# Patient Record
Sex: Male | Born: 1990 | Race: Black or African American | Hispanic: Yes | Marital: Married | Smoking: Never smoker
Health system: Southern US, Community
[De-identification: ages and names within clinical notes are randomized; demographics above are authoritative.]

---

## 2016-12-21 ENCOUNTER — Emergency Department (HOSPITAL_COMMUNITY): Payer: Self-pay

## 2016-12-21 ENCOUNTER — Encounter (HOSPITAL_COMMUNITY): Payer: Self-pay | Admitting: Emergency Medicine

## 2016-12-21 ENCOUNTER — Emergency Department (HOSPITAL_COMMUNITY)
Admission: EM | Admit: 2016-12-21 | Discharge: 2016-12-21 | Disposition: A | Discharge status: Home / Self Care | Payer: Self-pay | Attending: Emergency Medicine | Admitting: Emergency Medicine

## 2016-12-21 DIAGNOSIS — R809 Proteinuria, unspecified: Secondary | ICD-10-CM | POA: Insufficient documentation

## 2016-12-21 DIAGNOSIS — N50819 Testicular pain, unspecified: Secondary | ICD-10-CM

## 2016-12-21 DIAGNOSIS — N5082 Scrotal pain: Secondary | ICD-10-CM

## 2016-12-21 DIAGNOSIS — N390 Urinary tract infection, site not specified: Secondary | ICD-10-CM | POA: Insufficient documentation

## 2016-12-21 DIAGNOSIS — N50811 Right testicular pain: Secondary | ICD-10-CM | POA: Insufficient documentation

## 2016-12-21 DIAGNOSIS — R319 Hematuria, unspecified: Secondary | ICD-10-CM

## 2016-12-21 DIAGNOSIS — R1030 Lower abdominal pain, unspecified: Secondary | ICD-10-CM

## 2016-12-21 LAB — CBC
HCT: 42 % (ref 39.0–52.0)
Hemoglobin: 14 g/dL (ref 13.0–17.0)
MCH: 27.1 pg (ref 26.0–34.0)
MCHC: 33.3 g/dL (ref 30.0–36.0)
MCV: 81.2 fL (ref 78.0–100.0)
PLATELETS: 261 10*3/uL (ref 150–400)
RBC: 5.17 MIL/uL (ref 4.22–5.81)
RDW: 13.3 % (ref 11.5–15.5)
WBC: 9.7 10*3/uL (ref 4.0–10.5)

## 2016-12-21 LAB — URINALYSIS, ROUTINE W REFLEX MICROSCOPIC
Bacteria, UA: NONE SEEN
Bilirubin Urine: NEGATIVE
GLUCOSE, UA: NEGATIVE mg/dL
KETONES UR: NEGATIVE mg/dL
Leukocytes, UA: NEGATIVE
Nitrite: NEGATIVE
PH: 5 (ref 5.0–8.0)
Protein, ur: 100 mg/dL — AB
Specific Gravity, Urine: 1.016 (ref 1.005–1.030)

## 2016-12-21 LAB — COMPREHENSIVE METABOLIC PANEL
ALK PHOS: 92 U/L (ref 38–126)
ALT: 43 U/L (ref 17–63)
AST: 28 U/L (ref 15–41)
Albumin: 3.7 g/dL (ref 3.5–5.0)
Anion gap: 8 (ref 5–15)
BUN: 17 mg/dL (ref 6–20)
CALCIUM: 8.8 mg/dL — AB (ref 8.9–10.3)
CO2: 24 mmol/L (ref 22–32)
CREATININE: 1.26 mg/dL — AB (ref 0.61–1.24)
Chloride: 109 mmol/L (ref 101–111)
Glucose, Bld: 141 mg/dL — ABNORMAL HIGH (ref 65–99)
Potassium: 3.5 mmol/L (ref 3.5–5.1)
Sodium: 141 mmol/L (ref 135–145)
Total Bilirubin: 1.4 mg/dL — ABNORMAL HIGH (ref 0.3–1.2)
Total Protein: 6.8 g/dL (ref 6.5–8.1)

## 2016-12-21 LAB — LIPASE, BLOOD: Lipase: 27 U/L (ref 11–51)

## 2016-12-21 MED ORDER — DOXYCYCLINE HYCLATE 100 MG PO CAPS: 100.0000 mg | ORAL_CAPSULE | Freq: Two times a day (BID) | ORAL | 0 refills | Status: AC

## 2016-12-21 MED ORDER — FENTANYL CITRATE (PF) 100 MCG/2ML IJ SOLN
50.0000 ug | Freq: Once | INTRAMUSCULAR | Status: AC
  Administered 2016-12-21: 50 ug via INTRAVENOUS
  Filled 2016-12-21: qty 2

## 2016-12-21 NOTE — ED Triage Notes (Signed)
C/o lower abd pain that radiates to lower back since Friday morning.  Denies nausea, vomiting, diarrhea, and urinary complaints.

## 2016-12-21 NOTE — Discharge Instructions (Signed)
1. Medications: Doxycycline, usual home medications 2. Treatment: rest, drink plenty of fluids, take medications as prescribed 3. Follow Up: Please followup with urology in 7-10 days for discussion of your diagnoses and further evaluation after today's visit; if you do not have a primary care doctor use the resource guide provided to find one; return to the ER for fevers, persistent vomiting, worsening abdominal pain or other concerning symptoms.

## 2016-12-21 NOTE — ED Provider Notes (Signed)
[facility]-EMERGENCY DEPT Provider Note   CSN: [id number] Arrival date & time: 12/21/16  0248     History   Chief Complaint Chief Complaint  Patient presents with  . Abdominal Pain    HPI Sean Middleton is a 26 y.o. male with No major medical problems presents to the Emergency Department complaining of gradual, persistent, progressively worsening lower abdominal pain which radiates into the groin and into the low back patient denies recent falls or trauma. He denies IV drug use, anticoagulants, fevers, nausea or vomiting. Patient denies dysuria, hematuria, urinary urgency or frequency.  He reports that ibuprofen has helped the pain and nothing has increased it.  He denies sick contacts, recent travel or recent antibiotics. He reports the pain as sharp and stabbing. He denies any ripping or tearing sensation.  The history is provided by the patient and medical records. No language interpreter was used.    History reviewed. No pertinent past medical history.  There are no active problems to display for this patient.   History reviewed. No pertinent surgical history.     Home Medications    Prior to Admission medications   Medication Sig Start Date End Date Taking? Authorizing Provider  ibuprofen (ADVIL,MOTRIN) 200 MG tablet Take 400 mg by mouth every 6 (six) hours as needed for moderate pain.   Yes Historical Provider, MD  doxycycline (VIBRAMYCIN) 100 MG capsule Take 1 capsule (100 mg total) by mouth 2 (two) times daily. 12/21/16   Dahlia Client Mieko Kneebone, PA-C    Family History No family history on file.  Social History Social History  Substance Use Topics  . Smoking status: Never Smoker  . Smokeless tobacco: Never Used  . Alcohol use No     Allergies   Patient has no known allergies.   Review of Systems Review of Systems  Gastrointestinal: Positive for abdominal pain.  Musculoskeletal: Positive for back pain ( low).  All other systems reviewed and are  negative.    Physical Exam Updated Vital Signs BP (!) 155/99   Pulse 82   Temp 98.5 F (36.9 C) (Oral)   Resp 16   Ht  (1.651 m)   Wt 75.4 kg   SpO2 99%   BMI 27.64 kg/m   Physical Exam  Constitutional: He appears well-developed and well-nourished. No distress.  Awake, alert, nontoxic appearance  HENT:  Head: Normocephalic and atraumatic.  Mouth/Throat: Oropharynx is clear and moist. No oropharyngeal exudate.  Eyes: Conjunctivae are normal. No scleral icterus.  Neck: Normal range of motion. Neck supple.  Full ROM without pain  Cardiovascular: Normal rate, regular rhythm and intact distal pulses.   Pulmonary/Chest: Effort normal and breath sounds normal. No respiratory distress. He has no wheezes.  Equal chest expansion  Abdominal: Soft. Bowel sounds are normal. He exhibits no distension and no mass. There is tenderness in the suprapubic area. There is no rebound and no guarding. Hernia confirmed negative in the right inguinal area and confirmed negative in the left inguinal area.  Genitourinary: Cremasteric reflex is present. Right testis shows tenderness. Right testis shows no mass and no swelling. Right testis is descended. Cremasteric reflex is not absent on the right side. Left testis shows tenderness. Left testis shows no mass and no swelling. Left testis is descended. Cremasteric reflex is not absent on the left side. Uncircumcised. No phimosis, paraphimosis, hypospadias, penile erythema or penile tenderness. No discharge found.  Genitourinary Comments: Testicular tenderness R>L  Musculoskeletal: Normal range of motion. He exhibits no edema.  Full  range of motion of the T-spine and L-spine Mild midline TTP of the L-spine; no midline TTP to the T-spine Mild tenderness to palpation of the paraspinous muscles of the L-spine  Lymphadenopathy:    He has no cervical adenopathy. No inguinal adenopathy noted on the right or left side.  Neurological: He is alert. He has normal  reflexes.  Reflex Scores:      Bicep reflexes are 2+ on the right side and 2+ on the left side.      Brachioradialis reflexes are 2+ on the right side and 2+ on the left side.      Patellar reflexes are 2+ on the right side and 2+ on the left side.      Achilles reflexes are 2+ on the right side and 2+ on the left side. Speech is clear and goal oriented Moves extremities without ataxia  Skin: Skin is warm and dry. No rash noted. He is not diaphoretic. No erythema.  Psychiatric: He has a normal mood and affect. His behavior is normal.  Nursing note and vitals reviewed.    ED Treatments / Results  Labs (all labs ordered are listed, but only abnormal results are displayed) Labs Reviewed  COMPREHENSIVE METABOLIC PANEL - Abnormal; Notable for the following:       Result Value   Glucose, Bld 141 (*)    Creatinine, Ser 1.26 (*)    Calcium 8.8 (*)    Total Bilirubin 1.4 (*)    All other components within normal limits  URINALYSIS, ROUTINE W REFLEX MICROSCOPIC - Abnormal; Notable for the following:    Hgb urine dipstick MODERATE (*)    Protein, ur 100 (*)    Squamous Epithelial / LPF 0-5 (*)    All other components within normal limits  URINE CULTURE  LIPASE, BLOOD  CBC  GC/CHLAMYDIA PROBE AMP ([place]) NOT AT [facility]    EKG  EKG Interpretation None       Radiology US Scrotum  Result Date: 12/21/2016 CLINICAL DATA:  Right testicular pain for 1 day. EXAM: SCROTAL ULTRASOUND DOPPLER ULTRASOUND OF THE TESTICLES TECHNIQUE: Complete ultrasound examination of the testicles, epididymis, and other scrotal structures was performed. Color and spectral Doppler ultrasound were also utilized to evaluate blood flow to the testicles. COMPARISON:  None. FINDINGS: Right testicle Measurements: 3.2 x 2.0 x 2.5 cm. Homogeneous echogenicity. Normal blood flow. No mass or microlithiasis visualized. Left testicle Measurements: 3.5 x 1.9 x 2.4 cm. Homogeneous echogenicity. Normal blood flow. No mass or  microlithiasis visualized. Right epididymis:  Normal in size and appearance. Left epididymis:  Normal in size and appearance. Hydrocele:  None visualized. Varicocele:  Small on the left.  No right varicoceles. Pulsed Doppler interrogation of both testes demonstrates normal low resistance arterial and venous waveforms bilaterally. IMPRESSION: 1. No explanation for right scrotal pain. 2. Incidental note of left varicocele. Electronically Signed   By: Rubye Oaks M.D.   On: 12/21/2016 05:28   [place] Art/ven Flow Abd Pelv Doppler  Result Date: 12/21/2016 CLINICAL DATA:  Right testicular pain for 1 day. EXAM: SCROTAL ULTRASOUND DOPPLER ULTRASOUND OF THE TESTICLES TECHNIQUE: Complete ultrasound examination of the testicles, epididymis, and other scrotal structures was performed. Color and spectral Doppler ultrasound were also utilized to evaluate blood flow to the testicles. COMPARISON:  None. FINDINGS: Right testicle Measurements: 3.2 x 2.0 x 2.5 cm. Homogeneous echogenicity. Normal blood flow. No mass or microlithiasis visualized. Left testicle Measurements: 3.5 x 1.9 x 2.4 cm. Homogeneous echogenicity. Normal blood flow.  No mass or microlithiasis visualized. Right epididymis:  Normal in size and appearance. Left epididymis:  Normal in size and appearance. Hydrocele:  None visualized. Varicocele:  Small on the left.  No right varicoceles. Pulsed Doppler interrogation of both testes demonstrates normal low resistance arterial and venous waveforms bilaterally. IMPRESSION: 1. No explanation for right scrotal pain. 2. Incidental note of left varicocele. Electronically Signed   By: Rubye Oaks M.D.   On: 12/21/2016 05:28   Ct Renal Stone Study  Result Date: 12/21/2016 CLINICAL DATA:  Right testicular pain. Lower abdominal pain radiating to the back. EXAM: CT ABDOMEN AND PELVIS WITHOUT CONTRAST TECHNIQUE: Multidetector CT imaging of the abdomen and pelvis was performed following the standard protocol without IV  contrast. COMPARISON:  None. FINDINGS: Lower chest: Linear atelectasis in both lung bases. No pleural fluid. Hepatobiliary: No focal liver abnormality is seen. No gallstones, gallbladder wall thickening, or biliary dilatation. Pancreas: No ductal dilatation or inflammation. Spleen: Normal in size without focal abnormality. Small splenule at the hilum. Adrenals/Urinary Tract: The kidneys are symmetric in size without stones or hydronephrosis. There is no perinephric stranding. Both ureters are decompressed without stones along the course. Urinary bladder is minimally distended without stone. Adrenal glands are normal. Stomach/Bowel: Stomach is within normal limits. Appendix appears normal. No evidence of bowel wall thickening, distention, or inflammatory changes. Vascular/Lymphatic: No significant vascular findings are present. No enlarged abdominal or pelvic lymph nodes. Reproductive: Prostate is unremarkable. Scrotal ultrasound is in progress for evaluation of testicular pain. Other: No free air, free fluid, or intra-abdominal fluid collection. Musculoskeletal: There are no acute or suspicious osseous abnormalities. Punctate bone island in the left proximal femur. IMPRESSION: No explanation for pain.  No renal stones or obstructive uropathy. Electronically Signed   By: Rubye Oaks M.D.   On: 12/21/2016 05:06    Procedures Procedures (including critical care time)  Medications Ordered in ED Medications  fentaNYL (SUBLIMAZE) injection 50 mcg (50 mcg Intravenous Given 12/21/16 0445)     Initial Impression / Assessment and Plan / ED Course  I have reviewed the triage vital signs and the nursing notes.  Pertinent labs & imaging results that were available during my care of the patient were reviewed by me and considered in my medical decision making (see chart for details).     Isn't presents with lower abdominal pain that radiates down into his groin. On exam patient with mild tenderness to the  bilateral testes right greater than left.  Labs are reassuring. Urinalysis shows a hemoglobin and protein but no leukocytes or nitrites. Slightly elevated creatinine however no baseline for comparison.  Scrotal ultrasound is without evidence of testicular torsion or large mass. Clinical exam consistent with possible epididymitis.  CT renal without evidence of hydronephrosis or renal stone.  Possibility for stone is not visualized on CT.  On repeat exam patient abdomen remains soft with minimal tenderness. Will treat for possible UTI. Patient will need to see urology due to his proteinuria and hematuria. He will also need recheck of his serum creatinine. Patient states understanding.  Patient noted to be hypertensive in the emergency department.  No signs of hypertensive urgency.  Discussed with patient the need for close follow-up and management by their primary care physician.   The patient was discussed with and seen by Dr. Blinda Leatherwood who agrees with the treatment plan.    Final Clinical Impressions(s) / ED Diagnoses   Final diagnoses:  Lower abdominal pain  Lower urinary tract infectious disease  Testicular  pain  Hematuria, unspecified type  Proteinuria, unspecified type    New Prescriptions New Prescriptions   DOXYCYCLINE (VIBRAMYCIN) 100 MG CAPSULE    Take 1 capsule (100 mg total) by mouth 2 (two) times daily.     Dahlia Client Molleigh Huot, PA-C 12/21/16 1610    Gilda Crease, MD 12/23/16 2312

## 2016-12-21 NOTE — ED Provider Notes (Signed)
Patient presented to the ER with left flank pain, left testicular pain  Face to face Exam: HEENT - PERRLA Lungs - CTAB Heart - RRR, no M/R/G Abd - S/NT/ND Neuro - alert, oriented x3  Plan: Patient complaining of pain in his lower back on the left side radiates down the left side of his groin and into his testicle. He had slight testicular tenderness without enlargement. Some unremarkable. Renal stone study unremarkable. Urinalysis does reveal hematuria. Will treat empirically for infection, follow-up with urology to recheck for persistent hematuria and need for any further testing.   Gilda Crease, MD 12/21/16 [phone]

## 2016-12-21 NOTE — ED Notes (Signed)
Patient transported to CT 

## 2016-12-22 LAB — URINE CULTURE: CULTURE: NO GROWTH

## 2017-10-08 IMAGING — CT CT RENAL STONE PROTOCOL
2 of 4 series · 10 of 46 positions shown, 11 images · non-contrast
Comparison: None.

CLINICAL DATA: Right testicular pain. Lower abdominal pain
radiating to the back.

EXAM:
CT ABDOMEN AND PELVIS WITHOUT CONTRAST
TECHNIQUE: Multidetector CT imaging of the abdomen and pelvis was performed
following the standard protocol without IV contrast.

[Series 201: stone study, idose (2) · axial · 0.80mm/px · z∈[+374,+774]mm · 7 of 96 slices shown, 8 images]
[im 8/96  soft-tissue]
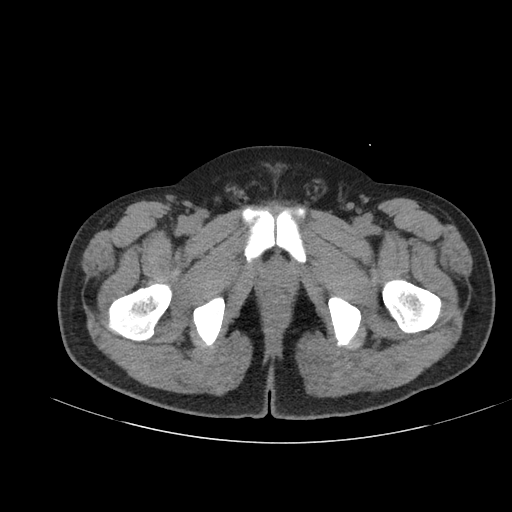
[im 8/96  bone]
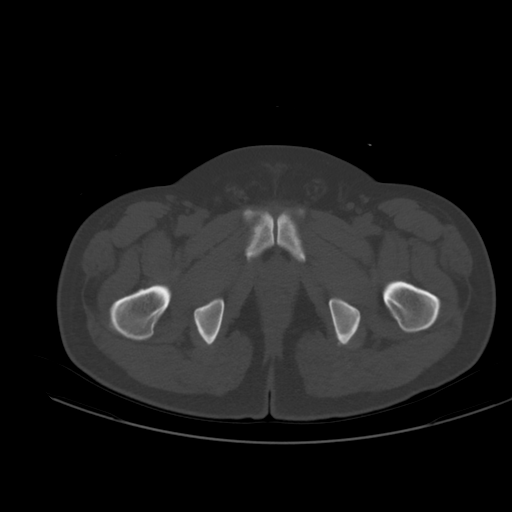
[im 20/96  soft-tissue]
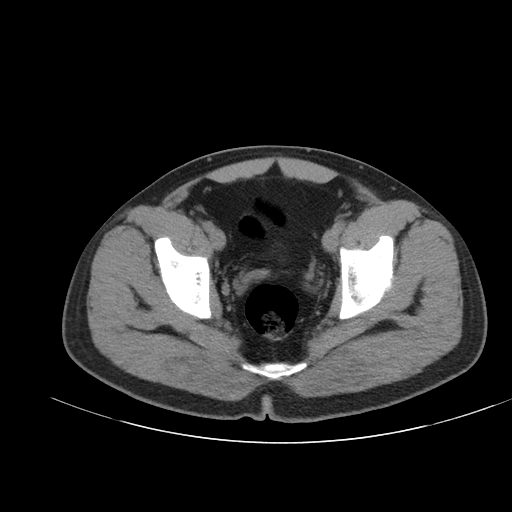
[im 36/96  soft-tissue]
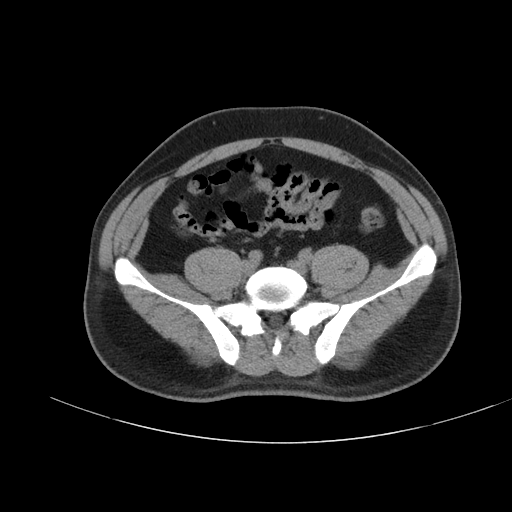
[im 48/96  soft-tissue]
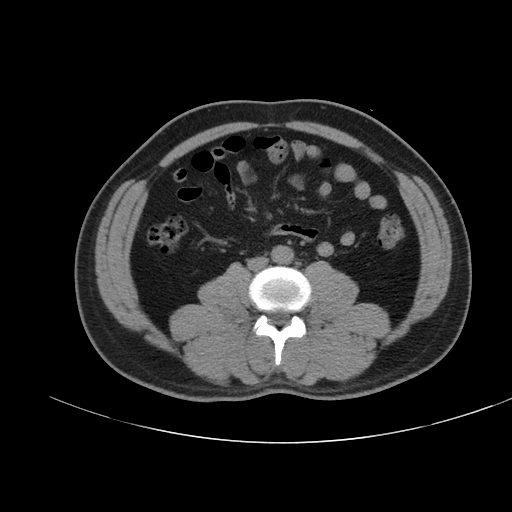
[im 60/96  soft-tissue]
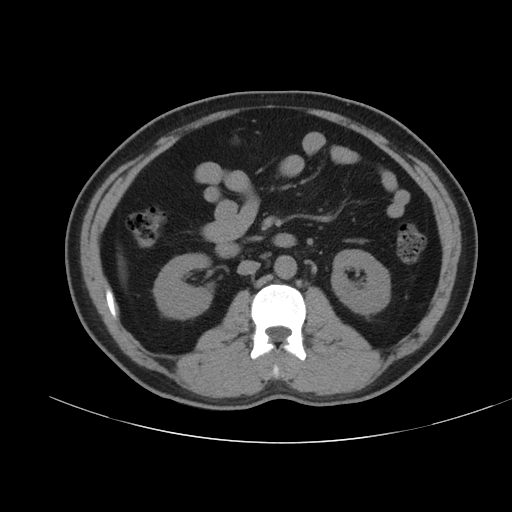
[im 76/96  soft-tissue]
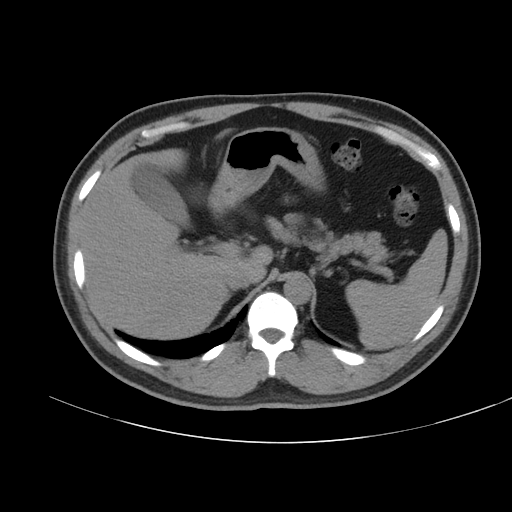
[im 88/96  soft-tissue]
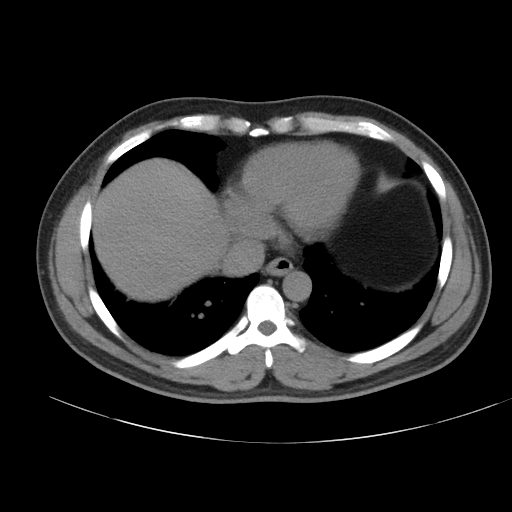

[Series 203: coronals, idose (2) · coronal · 0.45mm/px · 3 of 116 slices shown]
[im 39/116  soft-tissue]
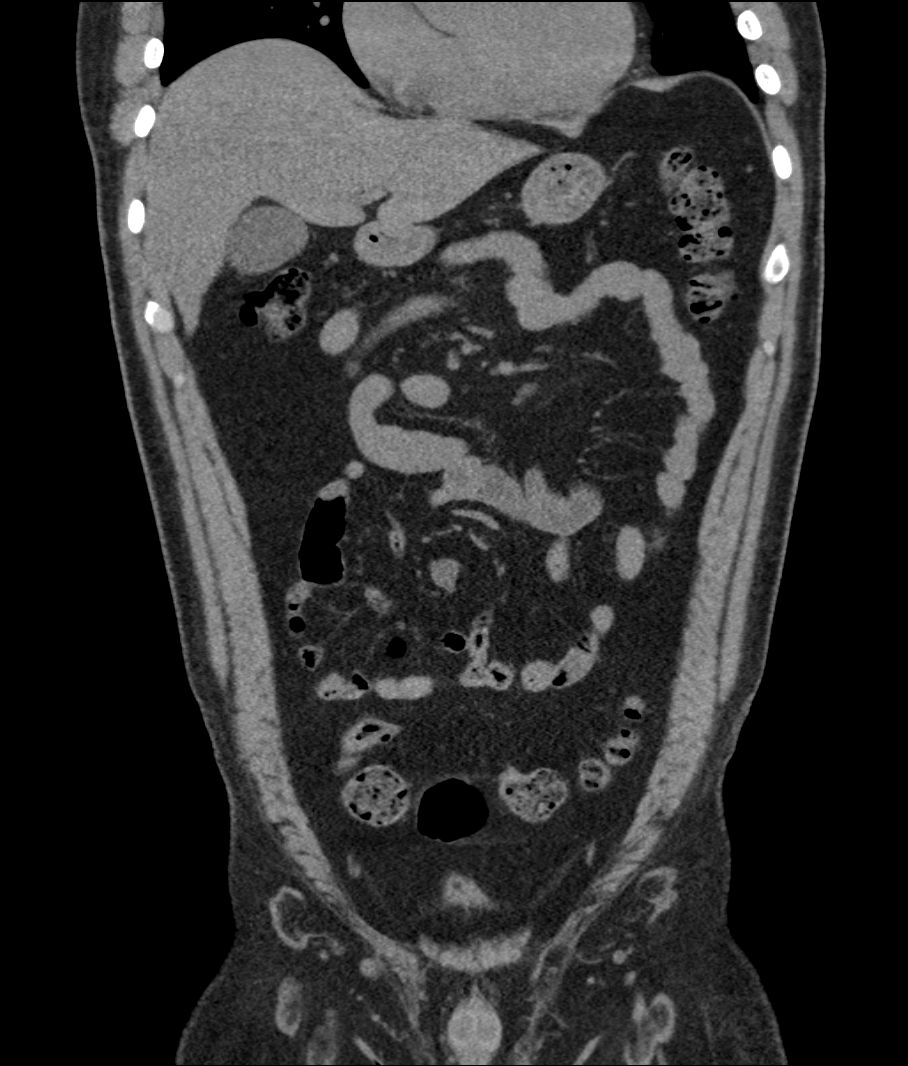
[im 52/116  soft-tissue]
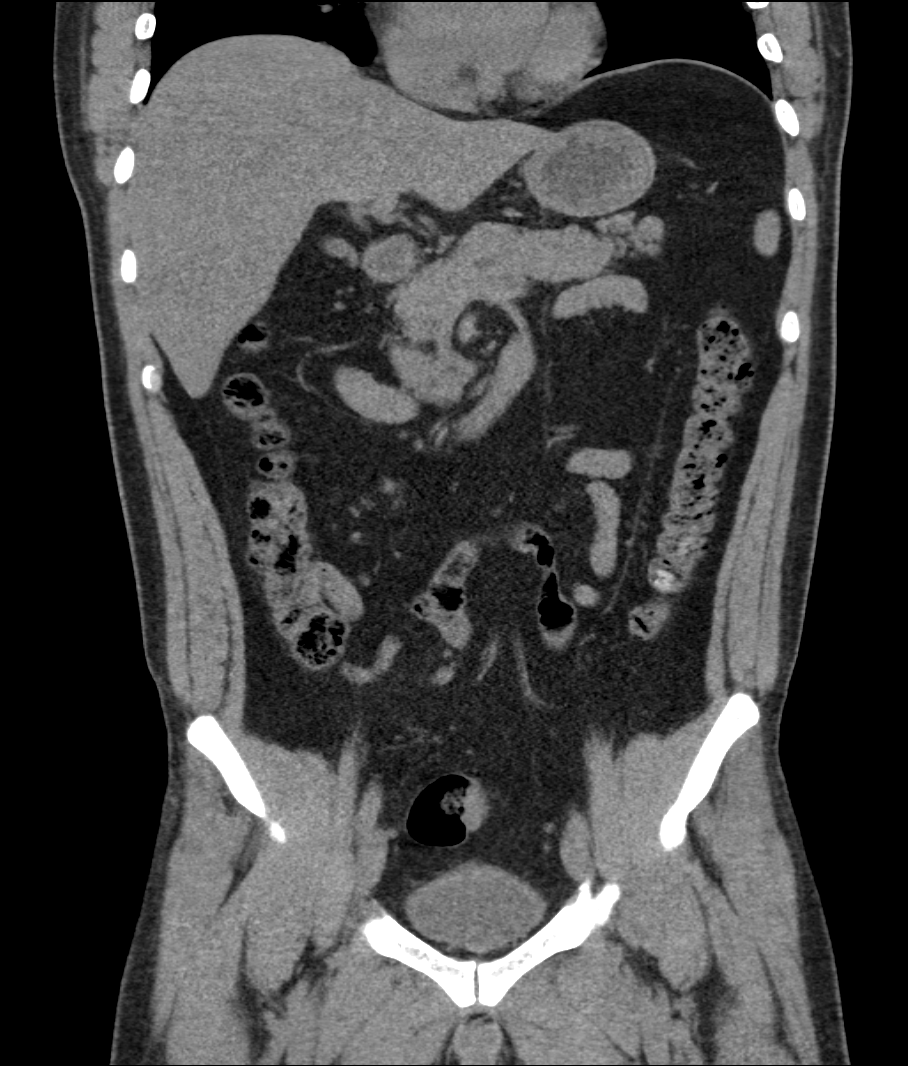
[im 64/116  soft-tissue]
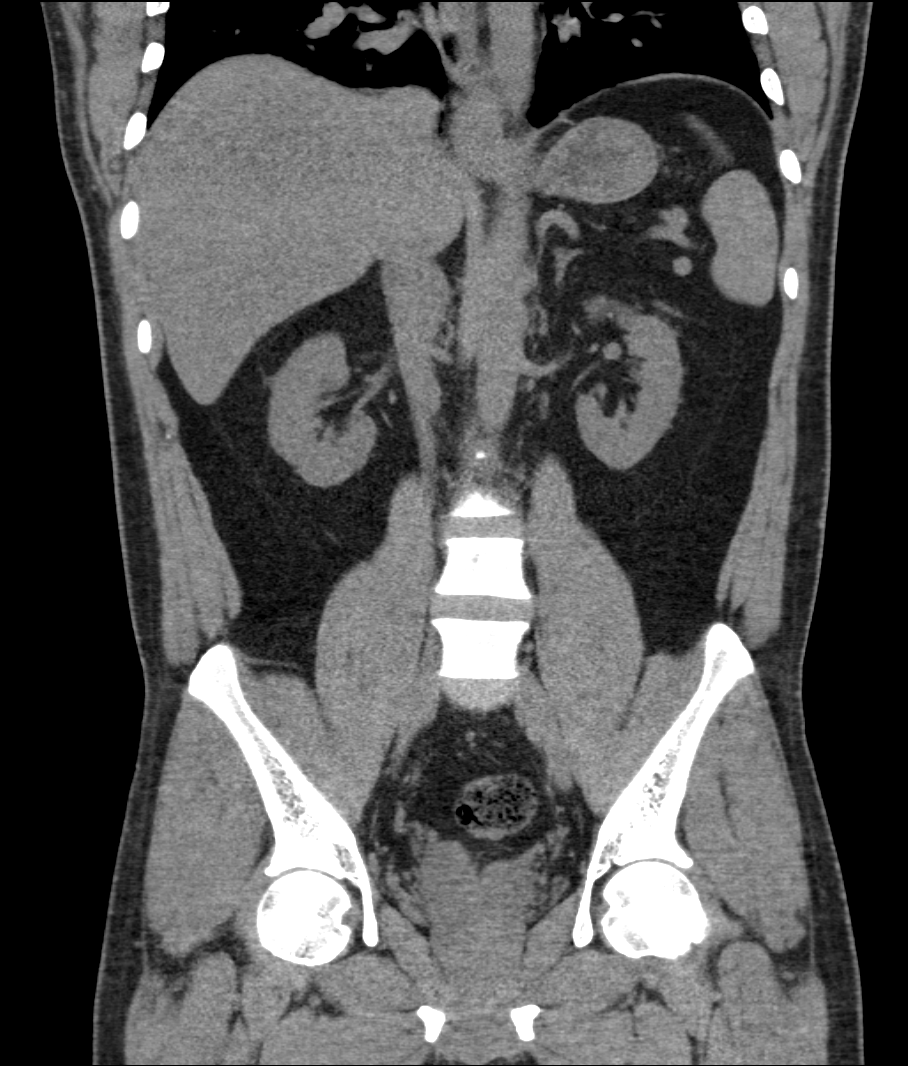

[10 of 46 positions shown; findings below may reference images not displayed]

FINDINGS: Lower chest: Linear atelectasis in both lung bases. No pleural
fluid.

Hepatobiliary: No focal liver abnormality is seen. No gallstones,
gallbladder wall thickening, or biliary dilatation.

Pancreas: No ductal dilatation or inflammation.

Spleen: Normal in size without focal abnormality. Small splenule at
the hilum.

Adrenals/Urinary Tract: The kidneys are symmetric in size without
stones or hydronephrosis. There is no perinephric stranding. Both
ureters are decompressed without stones along the course. Urinary
bladder is minimally distended without stone. Adrenal glands are
normal.

Stomach/Bowel: Stomach is within normal limits. Appendix appears
normal. No evidence of bowel wall thickening, distention, or
inflammatory changes.

Vascular/Lymphatic: No significant vascular findings are present. No
enlarged abdominal or pelvic lymph nodes.

Reproductive: Prostate is unremarkable. Scrotal ultrasound is in
progress for evaluation of testicular pain.

Other: No free air, free fluid, or intra-abdominal fluid collection.

Musculoskeletal: There are no acute or suspicious osseous
abnormalities. Punctate bone island in the left proximal femur.
IMPRESSION: No explanation for pain.  No renal stones or obstructive uropathy.
